# Patient Record
Sex: Male | Born: 1965 | Hispanic: Yes | Marital: Married | Smoking: Never smoker
Health system: Southern US, Community
[De-identification: ages and names within clinical notes are randomized; demographics above are authoritative.]

---

## 2009-01-06 ENCOUNTER — Ambulatory Visit: Payer: Self-pay | Admitting: Internal Medicine

## 2010-03-05 IMAGING — US ABDOMEN ULTRASOUND
1 series · 17 of 25 positions shown · non-contrast
Comparison: none

REASON FOR EXAM: right side abd pain vomiting  Needs spanish interpreter
COMMENTS:

[Series 1: abdomen ultrasound · 17 of 96 slices shown]
[im 1/96]
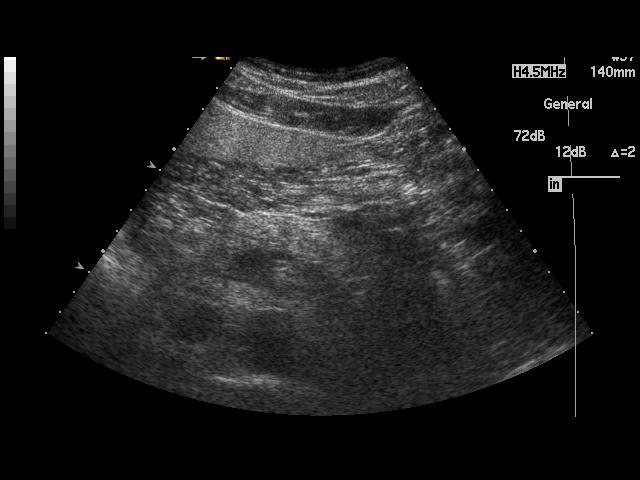
[im 8/96]
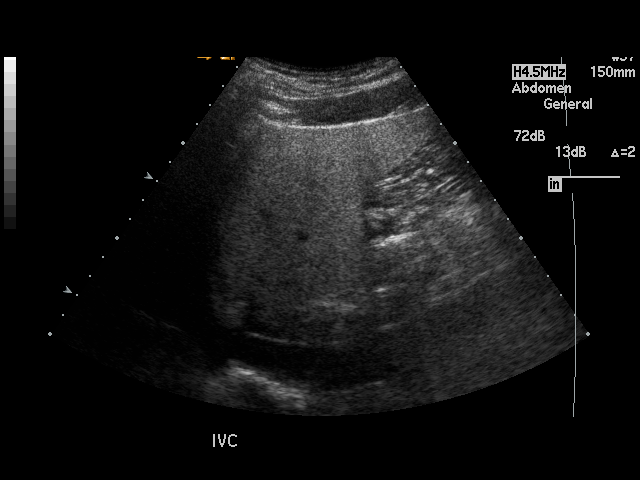
[im 12/96]
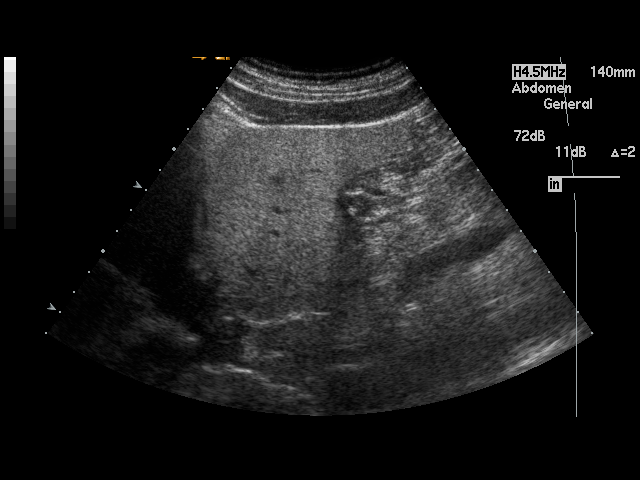
[im 20/96]
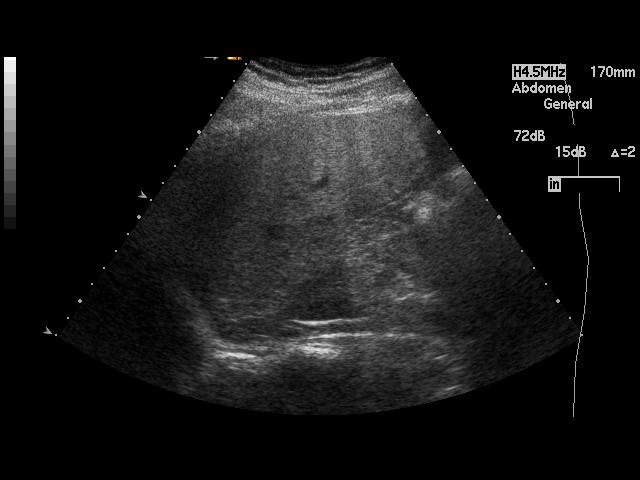
[im 24/96]
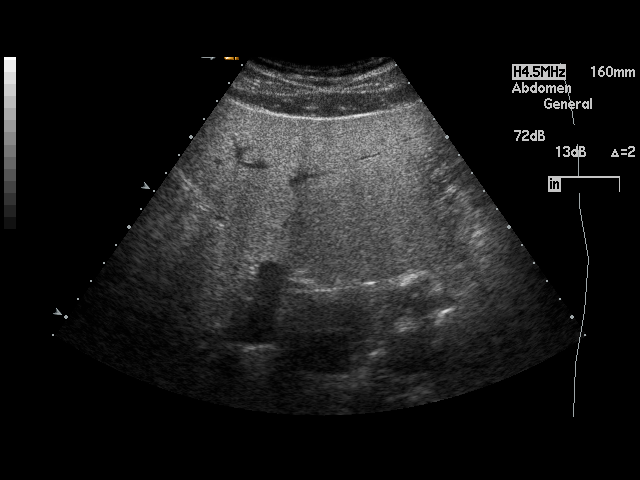
[im 32/96]
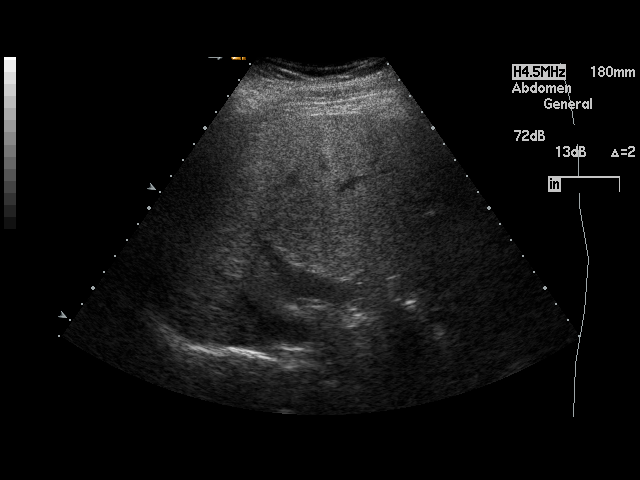
[im 36/96]
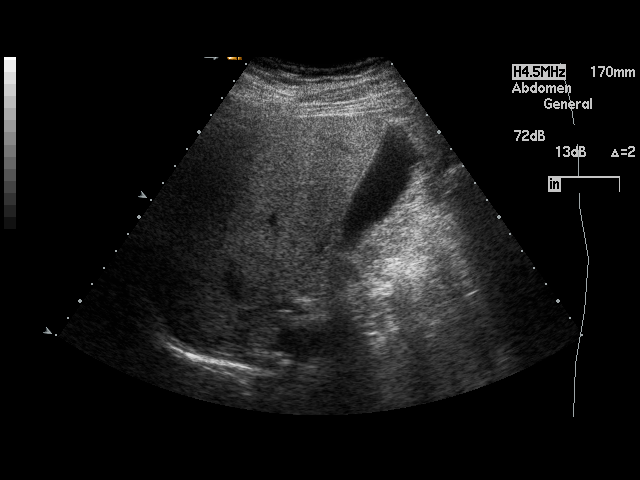
[im 44/96]
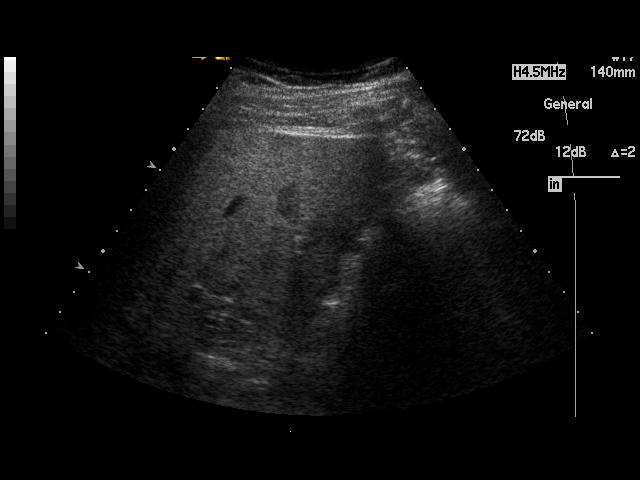
[im 48/96]
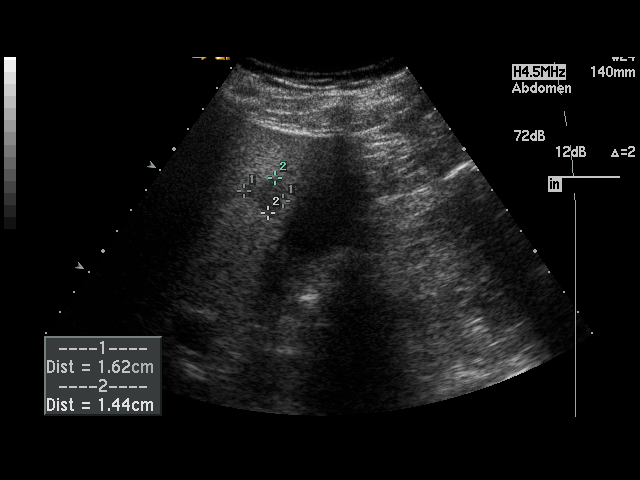
[im 52/96]
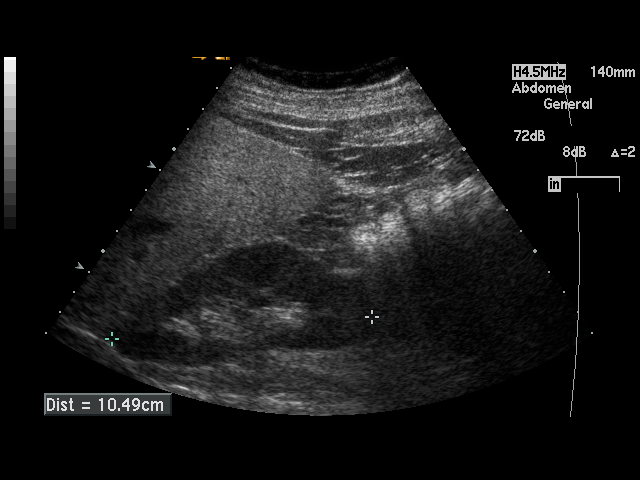
[im 60/96]
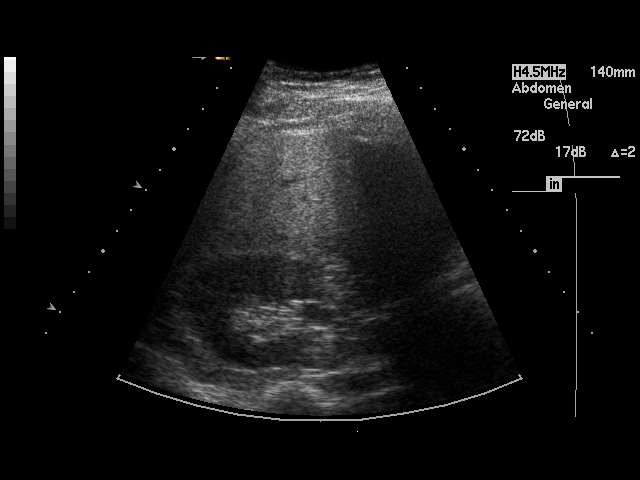
[im 64/96]
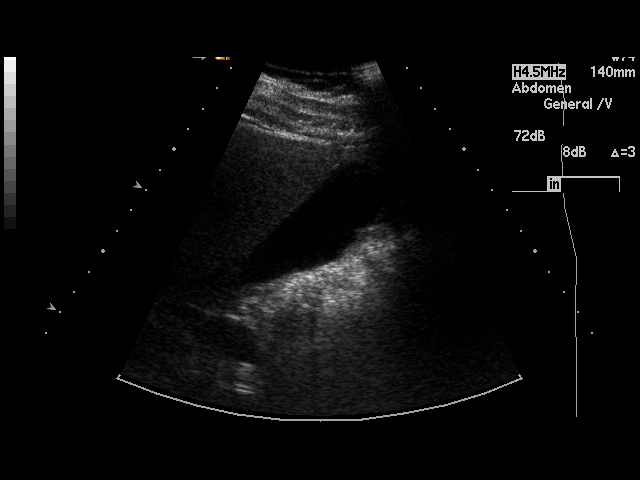
[im 72/96]
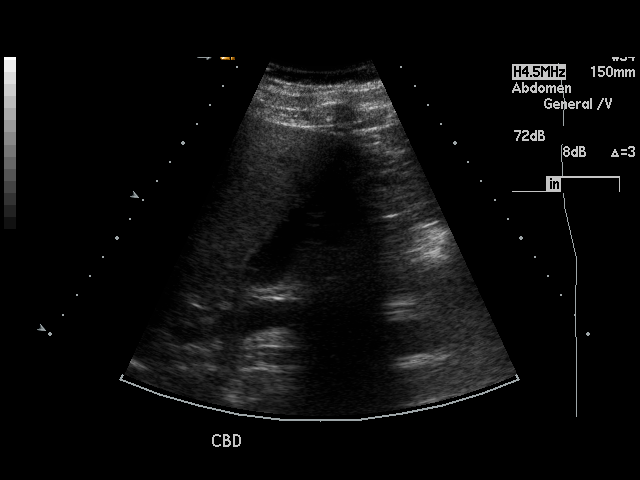
[im 76/96]
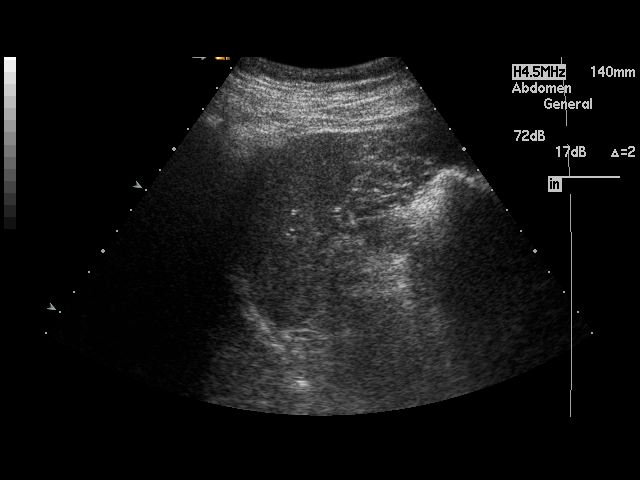
[im 84/96]
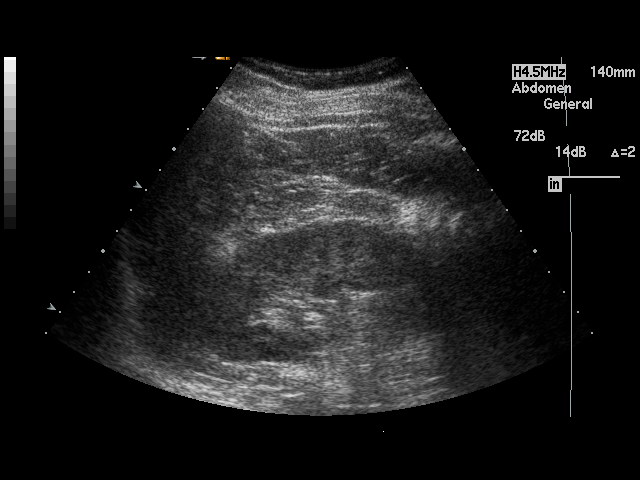
[im 88/96]
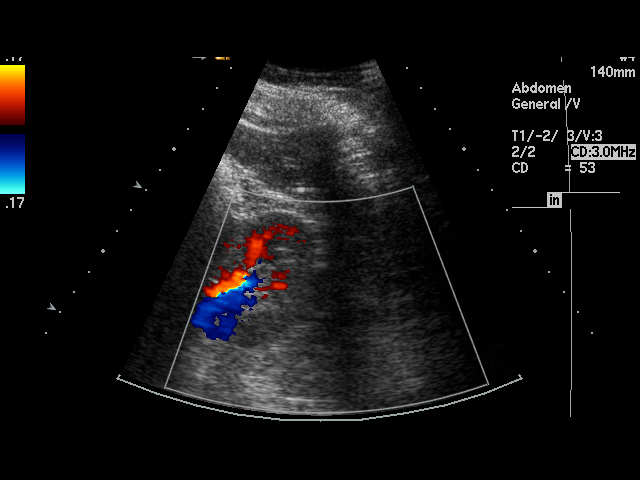
[im 96/96]
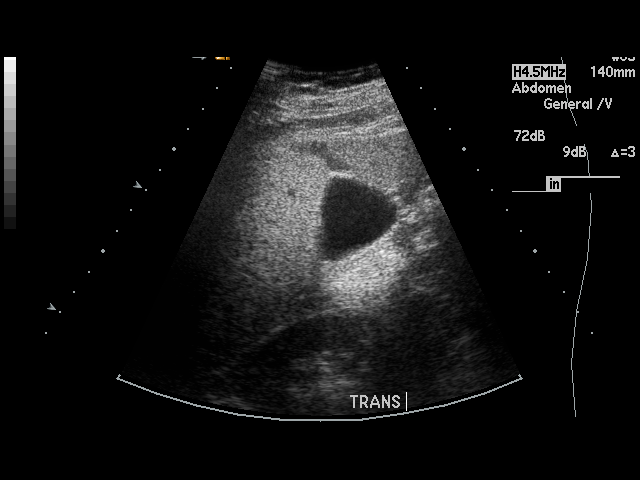

[17 of 25 positions shown; findings below may reference images not displayed]

PROCEDURE:     US  - US ABDOMEN GENERAL SURVEY  - January 06, 2009  [DATE]

RESULT:     The liver is dense consistent with fatty infiltration. The
spleen size is normal. The abdominal aorta and inferior vena cava show no
significant abnormalities. The visualized portion of the pancreas is normal
in appearance. The pancreatic tail is partially obscured on this exam. No
gallstones are seen. There is no thickening of the gallbladder wall. The
common bile duct measures 4.7 mm in diameter which is within normal limits.
The kidneys show no hydronephrosis. There is no ascites.
IMPRESSION: 1.  Probable fatty infiltration of the liver.
2.  No gallstones or other acute change is identified.
3.  There is no ascites.

## 2017-04-11 ENCOUNTER — Other Ambulatory Visit: Payer: Self-pay

## 2017-04-11 ENCOUNTER — Emergency Department
Admission: EM | Admit: 2017-04-11 | Discharge: 2017-04-11 | Disposition: A | Discharge status: Home / Self Care | Payer: Self-pay | Attending: Emergency Medicine | Admitting: Emergency Medicine

## 2017-04-11 DIAGNOSIS — G44219 Episodic tension-type headache, not intractable: Secondary | ICD-10-CM | POA: Insufficient documentation

## 2017-04-11 MED ORDER — BUTALBITAL-APAP-CAFFEINE 50-325-40 MG PO TABS: 1.0000 | ORAL_TABLET | Freq: Four times a day (QID) | ORAL | 0 refills | Status: AC | PRN

## 2017-04-11 NOTE — ED Notes (Signed)
Interpreter used for triage.  

## 2017-04-11 NOTE — ED Notes (Signed)
Interpreter Maryjane Hurtertto at bedside.

## 2017-04-11 NOTE — ED Notes (Addendum)
Interpreter requested. Pt reporting headache only while at work X 3-4 days, denies dizziness.

## 2017-04-11 NOTE — ED Notes (Signed)
Discharge instructions given with Interpreter Maryjane HurterOtto present.

## 2017-04-11 NOTE — ED Notes (Signed)
Patient denies pain and is resting comfortably.  

## 2017-04-11 NOTE — ED Triage Notes (Signed)
Pt reports headaches for last 3 days, worse when bending over. Headache intermittent. Denies dizziness or vision changes. No medications taken PTA. Pt alert and oriented X4, active, cooperative, pt in NAD. RR even and unlabored, color WNL.

## 2017-04-11 NOTE — ED Provider Notes (Signed)
[facility] Emergency Department Provider Note   ____________________________________________   First MD Initiated Contact with Patient 04/11/17 1342     (approximate)  I have reviewed the triage vital signs and the nursing notes.   HISTORY  Chief Complaint Headache  Interpreter  HPI Jerome Harris is a 51 y.o. male patient complain intermittently frontal headache which increased when he bends over. Patient denies vision disturbance or vertigo. Patient denies  weakness associated this headache. Patient had not taken any medicine prior to arrival. 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 Patient state he'll he starts back of his head and radiates to the front and the side. History reviewed. No pertinent past medical history.  There are no active problems to display for this patient.   History reviewed. No pertinent surgical history.  Prior to Admission medications   Medication Sig Start Date End Date Taking? Authorizing Provider  butalbital-acetaminophen-caffeine (FIORICET, ESGIC) 50-325-40 MG tablet Take 1-2 tablets every 6 (six) hours as needed by mouth for headache. 04/11/17 04/11/18  Joni ReiningSmith, Ronald K, PA-C    Allergies Patient has no known allergies.  No family history on file.  Social History Social History   Tobacco Use  . Smoking status: Never Smoker  Substance Use Topics  . Alcohol use: Yes    Frequency: Never  . Drug use: Not on file    Review of Systems Constitutional: No fever/chills Eyes: No visual changes. ENT: No sore throat. Cardiovascular: Denies chest pain. Respiratory: Denies shortness of breath. Gastrointestinal: No abdominal pain.  No nausea, no vomiting.  No diarrhea.  No constipation. Genitourinary: Negative for dysuria. Musculoskeletal: Negative for back pain. Skin: Negative for rash. Neurological: Positive for headaches, but denies focal weakness or  numbness.   ____________________________________________   PHYSICAL EXAM:  VITAL SIGNS: ED Triage Vitals  Enc Vitals Group     BP 04/11/17 1300 137/69     Pulse Rate 04/11/17 1300 (!) 58     Resp 04/11/17 1300 16     Temp 04/11/17 1300 97.9 F (36.6 C)     Temp Source 04/11/17 1300 Oral     SpO2 04/11/17 1300 96 %     Weight 04/11/17 1335 185 lb (83.9 kg)     Height 04/11/17 1335 5\' 5"  (1.651 m)     Head Circumference --      Peak Flow --      Pain Score 04/11/17 1335 0     Pain Loc --      Pain Edu? --      Excl. in GC? --     Constitutional: Alert and oriented. Well appearing and in no acute distress. Eyes: Conjunctivae are normal. PERRL. EOMI. Head: Atraumatic. Nose: No congestion/rhinnorhea. Neck: No stridor.  No cervical spine tenderness to palpation. Hematological/Lymphatic/Immunilogical: No cervical lymphadenopathy. Cardiovascular: Normal rate, regular rhythm. Grossly normal heart sounds.  Good peripheral circulation. Respiratory: Normal respiratory effort.  No retractions. Lungs CTAB. Gastrointestinal: Soft and nontender. No distention. No abdominal bruits. No CVA tenderness. Musculoskeletal: No lower extremity tenderness nor edema.  No joint effusions. Neurologic:  Normal speech and language. No gross focal neurologic deficits are appreciated. No gait instability. Skin:  Skin is warm, dry and intact. No rash noted. Psychiatric: Mood and affect are normal. Speech and behavior are normal.  ____________________________________________   LABS (all labs ordered are listed, but only abnormal results are displayed)  Labs Reviewed - No data to display ____________________________________________  EKG   ____________________________________________  RADIOLOGY  No results found.  ____________________________________________   PROCEDURES  Procedure(s) performed: None  Procedures  Critical Care performed:  No  ____________________________________________   INITIAL IMPRESSION / ASSESSMENT AND PLAN / ED COURSE  As part of my medical decision making, I reviewed the following data within the electronic MEDICAL RECORD NUMBER    Tension headache. Patient given discharge care instructions. Patient advised to follow-up with dentist sometime clinic for continued care. Patient advised to take medication as needed for headache. Patient also advised to try this medication before driving to evaluate any side effects.    ____________________________________________   FINAL CLINICAL IMPRESSION(S) / ED DIAGNOSES  Final diagnoses:  Episodic tension-type headache, not intractable     ED Discharge Orders        Ordered    butalbital-acetaminophen-caffeine (FIORICET, ESGIC) 50-325-40 MG tablet  Every 6 hours PRN     04/11/17 1356       Note:  This document was prepared using Dragon voice recognition software and may include unintentional dictation errors.    Joni ReiningSmith, Ronald K, PA-C 04/11/17 1408    Don PerkingVeronese, [place], MD 04/17/17 [phone]
